# Patient Record
Sex: Female | Born: 1950 | Race: White | Hispanic: No | Marital: Married | Smoking: Never smoker
Health system: Southern US, Community
[De-identification: ages and names within clinical notes are randomized; demographics above are authoritative.]

## PROBLEM LIST (undated history)

## (undated) DIAGNOSIS — J302 Other seasonal allergic rhinitis: Secondary | ICD-10-CM

---

## 2012-11-11 ENCOUNTER — Encounter: Payer: Self-pay | Admitting: *Deleted

## 2012-11-11 ENCOUNTER — Emergency Department
Admission: EM | Admit: 2012-11-11 | Discharge: 2012-11-11 | Disposition: A | Discharge status: Home / Self Care | Payer: BC Managed Care – PPO | Source: Home / Self Care | Attending: Family Medicine | Admitting: Family Medicine

## 2012-11-11 ENCOUNTER — Emergency Department (INDEPENDENT_AMBULATORY_CARE_PROVIDER_SITE_OTHER): Payer: BC Managed Care – PPO

## 2012-11-11 DIAGNOSIS — S99929A Unspecified injury of unspecified foot, initial encounter: Secondary | ICD-10-CM

## 2012-11-11 DIAGNOSIS — M25569 Pain in unspecified knee: Secondary | ICD-10-CM

## 2012-11-11 DIAGNOSIS — IMO0002 Reserved for concepts with insufficient information to code with codable children: Secondary | ICD-10-CM

## 2012-11-11 DIAGNOSIS — S8391XA Sprain of unspecified site of right knee, initial encounter: Secondary | ICD-10-CM

## 2012-11-11 DIAGNOSIS — X500XXA Overexertion from strenuous movement or load, initial encounter: Secondary | ICD-10-CM

## 2012-11-11 HISTORY — DX: Other seasonal allergic rhinitis: J30.2

## 2012-11-11 MED ORDER — MELOXICAM 15 MG PO TABS: 15.0000 mg | ORAL_TABLET | Freq: Every day | ORAL | Status: AC

## 2012-11-11 NOTE — ED Notes (Signed)
Patient c/o right knee pain x 5 days. She twisted her knee when stepping off of a sidewalk then when sliding across a seat. No previous injury. Pain increases with movement.

## 2012-11-11 NOTE — ED Provider Notes (Signed)
History     CSN: [id number]  Arrival date & time 11/11/12  1620   First MD Initiated Contact with Patient 11/11/12 1650      Chief Complaint  Patient presents with  . Knee Pain      HPI Comments: Patient reports that she twisted her right knee about 5 days ago resulting in mild discomfort; later in the evening while sliding across a car seat she injured again resulting in increased right knee pain.  Her right knee pain has persisted, worse with weight bearing and walking.  It occasionally feels as if it may give way. She states that she embarked upon an organized exercise program about 3 months ago, and has had difficulty performing squats because of bilateral knee weakness (prior to her present injury).  Patient is a 62 y.o. female presenting with knee pain. The history is provided by the patient.  Knee Pain This is a new problem. Episode onset: 5 days ago. The problem occurs constantly. The problem has not changed since onset.Associated symptoms comments: none. The symptoms are aggravated by walking and standing (flexing right knee). The symptoms are relieved by NSAIDs. Treatments tried: Aleve. The treatment provided mild relief.    Past Medical History  Diagnosis Date  . Seasonal allergies     History reviewed. No pertinent past surgical history.  Family History  Problem Relation Age of Onset  . Aneurysm Father     History  Substance Use Topics  . Smoking status: Never Smoker   . Smokeless tobacco: Never Used  . Alcohol Use: Yes     Comment: 5/week    OB History    Grav Para Term Preterm Abortions TAB SAB Ect Mult Living                  Review of Systems  All other systems reviewed and are negative.    Allergies  Review of patient's allergies indicates no known allergies.  Home Medications   Current Outpatient Rx  Name  Route  Sig  Dispense  Refill  . MELOXICAM 15 MG PO TABS   Oral   Take 1 tablet (15 mg total) by mouth daily. Take with evening meal   15 tablet   0     BP 137/76  Pulse 67  Resp 14  Ht 5\' 6"  (1.676 m)  Wt 171 lb (77.565 kg)  BMI 27.60 kg/m2  SpO2 95%  Physical Exam  Constitutional: She is oriented to person, place, and time. She appears well-developed and well-nourished. No distress.  HENT:  Head: Atraumatic.  Eyes: Conjunctivae normal and EOM are normal. Pupils are equal, round, and reactive to light.  Musculoskeletal: She exhibits tenderness.       Right knee: She exhibits decreased range of motion. She exhibits no swelling, no effusion, no ecchymosis, no deformity, no erythema, normal alignment, no LCL laxity, normal patellar mobility, no bony tenderness, normal meniscus and no MCL laxity. tenderness found. No medial joint line, no lateral joint line, no MCL, no LCL and no patellar tendon tenderness noted.       Right knee reveals mild tenderness only in the popliteal fossa, but no swelling there.  She has discomfort with full extension.  McMurray test negative.  Knee stable; negative drawer test.  No erythema or warmth.  Neurological: She is alert and oriented to person, place, and time.  Skin: Skin is warm and dry. No rash noted.    ED Course  Procedures  none   Dg  Knee Complete 4 Views Right  11/11/2012  *RADIOLOGY REPORT*  Clinical Data: Right knee pain following a twisting injury 4 days ago.  RIGHT KNEE - COMPLETE 4+ VIEW  Comparison: None.  Findings: Mild medial and patellofemoral spur formation with mild medial joint space narrowing.  Minimal lateral spur formation.  No fracture, dislocation or effusion is seen.  Oval calcification in the popliteal space.  IMPRESSION:  1.  No fracture or effusion. 2.  Mild degenerative changes. 3.  Possible oval calcification in a popliteal cyst.  This is more posteriorly located than expected for a calcified popliteal artery aneurysm.   Original Report Authenticated By: Beckie Salts, M.D.      1. Right knee sprain       MDM  Hinged knee brace applied.  Rx for  Mobic. Apply ice pack two or three times daily.  Wear knee brace daytime.  Begin knee exercises in extension as per instruction sheet. Follow-up with Dr. Rodney Langton for management and coordinate knee (bilateral) knee strengthening program.         Lattie Haw, MD 11/11/12 1843

## 2012-11-12 ENCOUNTER — Ambulatory Visit (INDEPENDENT_AMBULATORY_CARE_PROVIDER_SITE_OTHER): Payer: BC Managed Care – PPO | Admitting: Sports Medicine

## 2012-11-12 ENCOUNTER — Telehealth: Payer: Self-pay | Admitting: *Deleted

## 2012-11-12 DIAGNOSIS — M2240 Chondromalacia patellae, unspecified knee: Secondary | ICD-10-CM | POA: Insufficient documentation

## 2012-11-12 NOTE — Progress Notes (Signed)
SPORTS MEDICINE CONSULTATION REPORT  Subjective:    I'm seeing this patient as a consultation for:  Dr. Cathren Harsh  CC: Knee pain  HPI: Teresa Hurley is a very pleasant 62 year old female who comes in with the unfortunate history of twisting her right knee with immediate pain. She went to the urgent care where x-rays were negative, she was placed in a knee brace, and sent here for further evaluation and definitive treatment. She is not yet take her Mobic. She localizes the pain over the anterior knee, and notes grinding of the knees taken to the range of motion. She denies any swelling. Pain is also worse when going up and down stairs, and she does note significant gelling through the day. Pain is moderate. She does do cross fit, and is wondering if she can continue this today.  She is also looking to switch primary care providers, and would like to switch to [place].  Past medical history, Surgical history, Family history not pertinant except as noted below, Social history, Allergies, and medications have been entered into the medical record, reviewed, and no changes needed.   Review of Systems: No headache, visual changes, nausea, vomiting, diarrhea, constipation, dizziness, abdominal pain, skin rash, fevers, chills, night sweats, weight loss, swollen lymph nodes, body aches, joint swelling, muscle aches, chest pain, shortness of breath, mood changes, visual or auditory hallucinations.   Objective:   General: Well Developed, well nourished, and in no acute distress.  Neuro/Psych: Alert and oriented x3, extra-ocular muscles intact, able to move all 4 extremities, sensation grossly intact. Skin: Warm and dry, no rashes noted.  Respiratory: Not using accessory muscles, speaking in full sentences, trachea midline.  Cardiovascular: Pulses palpable, no extremity edema. Abdomen: Does not appear distended. Right Knee: Normal to inspection with no erythema or effusion or obvious bony abnormalities. Palpation normal  with no warmth, joint line tenderness, patellar tenderness, or condyle tenderness. ROM full in flexion and extension and lower leg rotation. Ligaments with solid consistent endpoints including ACL, PCL, LCL, MCL. Negative Mcmurray's, Apley's, and Thessalonian tests. Non painful patellar compression. Patellar glide with significant crepitus. Patellar and quadriceps tendons unremarkable. Hamstring and quadriceps strength is normal.   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  Impression and Recommendations:   This case required medical decision making of moderate complexity.

## 2012-11-12 NOTE — Assessment & Plan Note (Addendum)
We had a long, long discussion about her options from here. She will fill her Mobic, and do home exercises. She does have a trip coming up to [place] in 2 weeks, if she has insufficient response to conservative therapy she will come in for an injection before this vacation. Otherwise I would like to set a tentative followup for 4 weeks. She did decline formal physical therapy, however if we do proceed to injection, I would like her to do this.

## 2012-11-16 ENCOUNTER — Ambulatory Visit: Payer: BC Managed Care – PPO | Admitting: Sports Medicine

## 2012-11-17 ENCOUNTER — Ambulatory Visit (INDEPENDENT_AMBULATORY_CARE_PROVIDER_SITE_OTHER): Payer: BC Managed Care – PPO | Admitting: Sports Medicine

## 2012-11-17 DIAGNOSIS — M2240 Chondromalacia patellae, unspecified knee: Secondary | ICD-10-CM

## 2012-11-17 NOTE — Progress Notes (Signed)
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 Overall her pain continues to be on the anterior aspect of both knees, worse with deep knee bends and going up stairs, radiation, mild swelling of the right knee, symptoms are moderate. No mechanical symptoms.  Past medical history, Surgical history, Family history not pertinant except as noted below, Social history, Allergies, and medications have been entered into the medical record, reviewed, and no changes needed.   Review of Systems: No headache, visual changes, nausea, vomiting, diarrhea, constipation, dizziness, abdominal pain, skin rash, fevers, chills, night sweats, weight loss, swollen lymph nodes, body aches, joint swelling, muscle aches, chest pain, shortness of breath, mood changes, visual or auditory hallucinations.   Objective:   General: Well Developed, well nourished, and in no acute distress.  Neuro/Psych: Alert and oriented x3, extra-ocular muscles intact, able to move all 4 extremities, sensation grossly intact. Skin: Warm and dry, no rashes noted.  Respiratory: Not using accessory muscles, speaking in full sentences, trachea midline.  Cardiovascular: Pulses palpable, no extremity edema. Abdomen: Does not appear distended. Bilateral Knee: Normal to inspection with no erythema or effusion or obvious bony abnormalities. Palpation normal with no warmth, joint line tenderness, patellar tenderness, or condyle tenderness. ROM full in flexion and extension and lower leg rotation. Ligaments with solid consistent endpoints including ACL, PCL, LCL, MCL. Negative Mcmurray's, Apley's, and Thessalonian tests. Non painful patellar compression. Patellar glide with crepitus. Patellar and quadriceps tendons  unremarkable. Hamstring and quadriceps strength is normal.   Procedure: Real-time Ultrasound Guided Injection of left suprapatellar recess. Device: GE Logiq E  Ultrasound guided injection is preferred based studies that show increased duration, increased effect, greater accuracy, decreased procedural pain, increased response rate, and decreased cost with ultrasound guided versus blind injection.  Verbal informed consent obtained.  Time-out conducted.  Noted no overlying erythema, induration, or other signs of local infection.  Skin prepped in a sterile fashion.  Local anesthesia: Topical Ethyl chloride.  With sterile technique and under real time ultrasound guidance:  2 cc Kenalog 40, 4 cc lidocaine injected into the suprapatellar recess. Completed without difficulty  Pain immediately resolved suggesting accurate placement of the medication.  Advised to call if fevers/chills, erythema, induration, drainage, or persistent bleeding.  Images permanently stored and available for review in the ultrasound unit.  Impression: Technically successful ultrasound guided injection.  Procedure: Real-time Ultrasound Guided Injection of right suprapatellar recess. Device: GE Logiq E  Ultrasound guided injection is preferred based studies that show increased duration, increased effect, greater accuracy, decreased procedural pain, increased response rate, and decreased cost with ultrasound guided versus blind injection.  Verbal informed consent obtained.  Time-out conducted.  Noted no overlying erythema, induration, or other signs of local infection.  Skin prepped in a sterile fashion.  Local anesthesia: Topical Ethyl chloride.  With sterile technique and under real time ultrasound guidance:  2 cc Kenalog 40, 4 cc lidocaine injected into the suprapatellar recess. Completed without difficulty  Pain immediately resolved suggesting accurate placement of the medication.  Advised to call if fevers/chills,  erythema, induration, drainage, or persistent bleeding.  Images permanently stored and available for review in the ultrasound unit.  Impression: Technically successful ultrasound guided injection.  Impression and Recommendations:   This case required medical decision making of moderate  complexity.

## 2012-11-17 NOTE — Assessment & Plan Note (Signed)
Ultrasound guided intra-articular injection bilaterally as above. Continue patellofemoral rehabilitation. Avoid deep knee bends past 90. Return to see me in 4 weeks, we can consider repeat injection versus MRI if no better.

## 2012-11-19 ENCOUNTER — Telehealth: Payer: Self-pay | Admitting: *Deleted

## 2012-11-19 ENCOUNTER — Other Ambulatory Visit: Payer: Self-pay | Admitting: Sports Medicine

## 2012-11-19 NOTE — Telephone Encounter (Signed)
I could give her some medicine to hold her over during the vacation, this would be Vicodin. We could certainly try a single additional injection but I would not do any more to the right knee in terms of steroid injections. When she gets back, we should certainly consider Visco supplementation. Have her let me know what she wants to do. I am happy to work her in if she desires a single additional injection to the right knee before vacation.

## 2012-11-19 NOTE — Telephone Encounter (Signed)
Pt called stating that the injection in her RT knee didn't work. States she is going out of the country on Monday and would like to know when she could get another injection or what is her next option. Please advise.

## 2012-11-22 NOTE — Telephone Encounter (Signed)
LMOM

## 2012-12-07 ENCOUNTER — Ambulatory Visit (INDEPENDENT_AMBULATORY_CARE_PROVIDER_SITE_OTHER): Payer: BC Managed Care – PPO

## 2012-12-07 ENCOUNTER — Ambulatory Visit (INDEPENDENT_AMBULATORY_CARE_PROVIDER_SITE_OTHER): Payer: BC Managed Care – PPO | Admitting: Sports Medicine

## 2012-12-07 DIAGNOSIS — M2240 Chondromalacia patellae, unspecified knee: Secondary | ICD-10-CM

## 2012-12-07 DIAGNOSIS — M722 Plantar fascial fibromatosis: Secondary | ICD-10-CM

## 2012-12-07 NOTE — Progress Notes (Signed)
  Subjective:    CC: Followup  HPI: Knees: Patellofemoral chondromalacia, improved status post injection.  Right foot pain: Was very painful on her vacation, particularly at the calcaneal insertion of the plantar fascia. She reported some swelling and redness over the toes. She went to the staff physician who thought it was gout, and treated her with dexamethasone. Overall her symptoms have improved with the exception of pain at the calcaneal insertion of the plantar fascia, worse in the morning, worse with weightbearing. Pain is localized, does not radiate.  Past medical history, Surgical history, Family history not pertinant except as noted below, Social history, Allergies, and medications have been entered into the medical record, reviewed, and no changes needed.   Review of Systems: No headache, visual changes, nausea, vomiting, diarrhea, constipation, dizziness, abdominal pain, skin rash, fevers, chills, night sweats, weight loss, swollen lymph nodes, body aches, joint swelling, muscle aches, chest pain, shortness of breath, mood changes, visual or auditory hallucinations.   Objective:   General: Well Developed, well nourished, and in no acute distress.  Neuro/Psych: Alert and oriented x3, extra-ocular muscles intact, able to move all 4 extremities, sensation grossly intact. Skin: Warm and dry, no rashes noted.  Respiratory: Not using accessory muscles, speaking in full sentences, trachea midline.  Cardiovascular: Pulses palpable, no extremity edema. Abdomen: Does not appear distended. Right Ankle: No visible erythema or swelling. Range of motion is full in all directions. Strength is 5/5 in all directions. Stable lateral and medial ligaments; squeeze test and kleiger test unremarkable; Talar dome nontender; Tender to palpation in the calcaneal portion of the plantar fascia from the plantar aspect. No pain at base of 5th MT; No tenderness over cuboid; No tenderness over N spot or  navicular prominence No tenderness on posterior aspects of lateral and medial malleolus No sign of peroneal tendon subluxations or tenderness to palpation Negative tarsal tunnel tinel's Able to walk 4 steps.  X-ray showing small plantar calcaneal spur. Impression and Recommendations:   This case required medical decision making of moderate complexity.

## 2012-12-07 NOTE — Assessment & Plan Note (Addendum)
Home rehabilitation. She will come back for custom orthotics. X-rays. Some of her symptoms were reminiscent of gout, I am going to check a uric acid level. If insufficient benefit we are going to perform a guided injection.

## 2012-12-07 NOTE — Assessment & Plan Note (Signed)
Symptoms completely resolved at the bilateral injection.

## 2012-12-08 LAB — URIC ACID: Uric Acid, Serum: 5.3 mg/dL (ref 2.4–7.0)

## 2012-12-14 ENCOUNTER — Ambulatory Visit (INDEPENDENT_AMBULATORY_CARE_PROVIDER_SITE_OTHER): Payer: BC Managed Care – PPO | Admitting: Sports Medicine

## 2012-12-14 DIAGNOSIS — M722 Plantar fascial fibromatosis: Secondary | ICD-10-CM

## 2012-12-14 NOTE — Progress Notes (Signed)
Patient was fitted for a : standard, cushioned, semi-rigid orthotic. The orthotic was heated and afterward the patient stood on the orthotic blank positioned on the orthotic stand. The patient was positioned in subtalar neutral position and 10 degrees of ankle dorsiflexion in a weight bearing stance. After completion of molding, a stable base was applied to the orthotic blank. The blank was ground to a stable position for weight bearing. Size: 9 Base: Blue EVA Additional Posting and Padding: None The patient ambulated these, and they were very comfortable.  I spent 40 minutes with this patient, greater than 50% was face to face time regarding counseling for plantar fasciitis and patellofemoral chondromalacia.

## 2012-12-14 NOTE — Assessment & Plan Note (Signed)
Orthotics as above. Continue rehabilitation exercises. No better in 4 weeks consider plantar fascia injection.

## 2012-12-15 ENCOUNTER — Ambulatory Visit: Payer: BC Managed Care – PPO | Admitting: Sports Medicine

## 2013-01-31 IMAGING — CR DG KNEE COMPLETE 4+V*R*
4 series · 4 of 4 positions shown · non-contrast
Comparison: None.

CLINICAL DATA: Right knee pain following a twisting injury 4 days
ago.

RIGHT KNEE - COMPLETE 4+ VIEW

[view not recorded (1 of 4)]
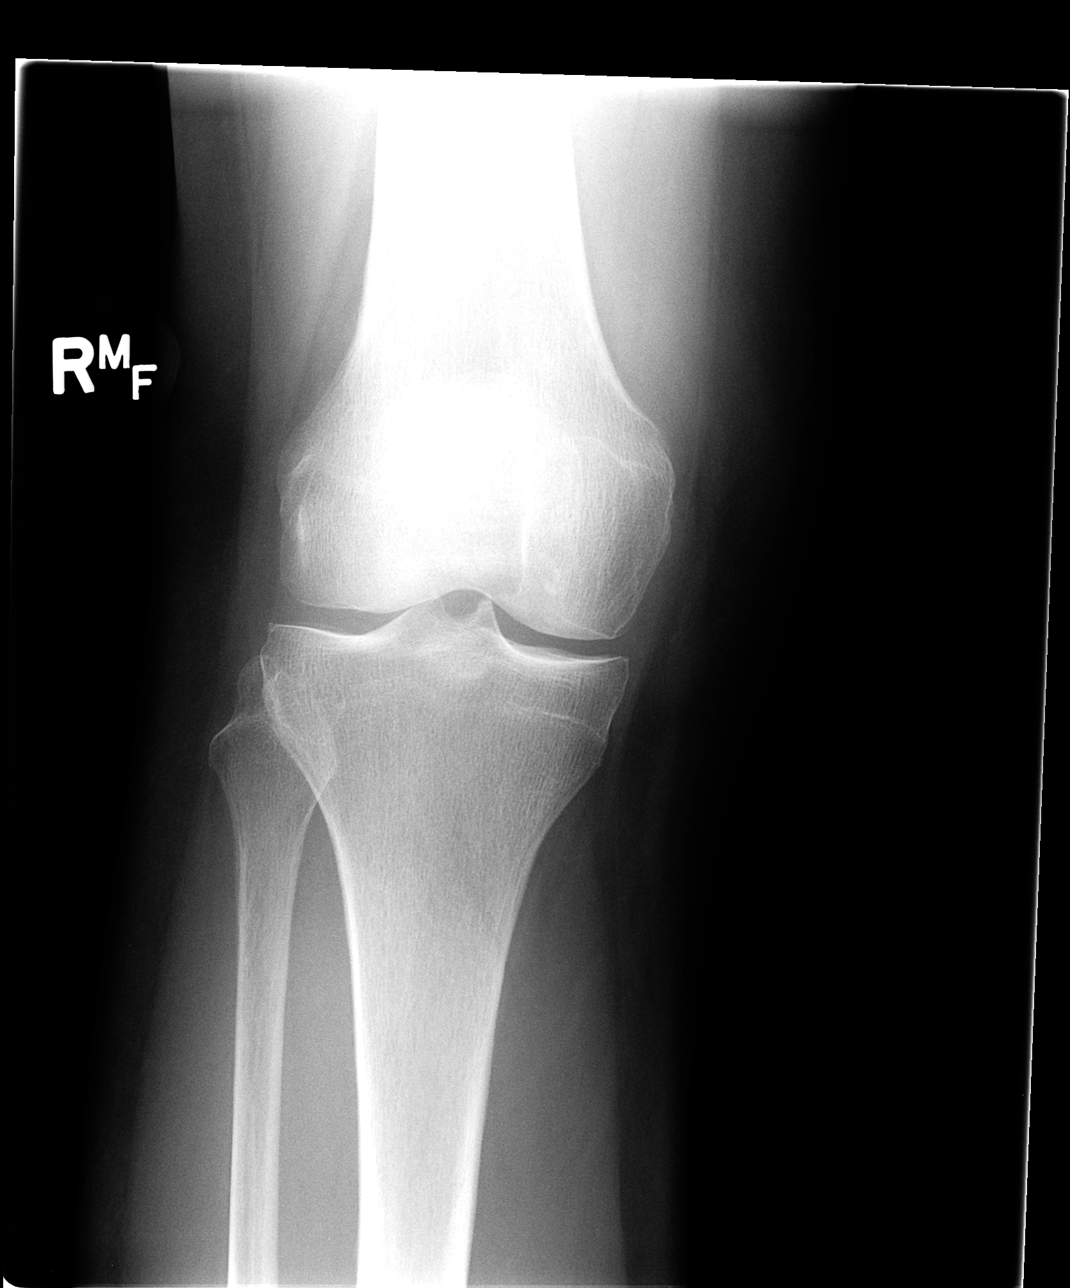

[view not recorded (2 of 4)]
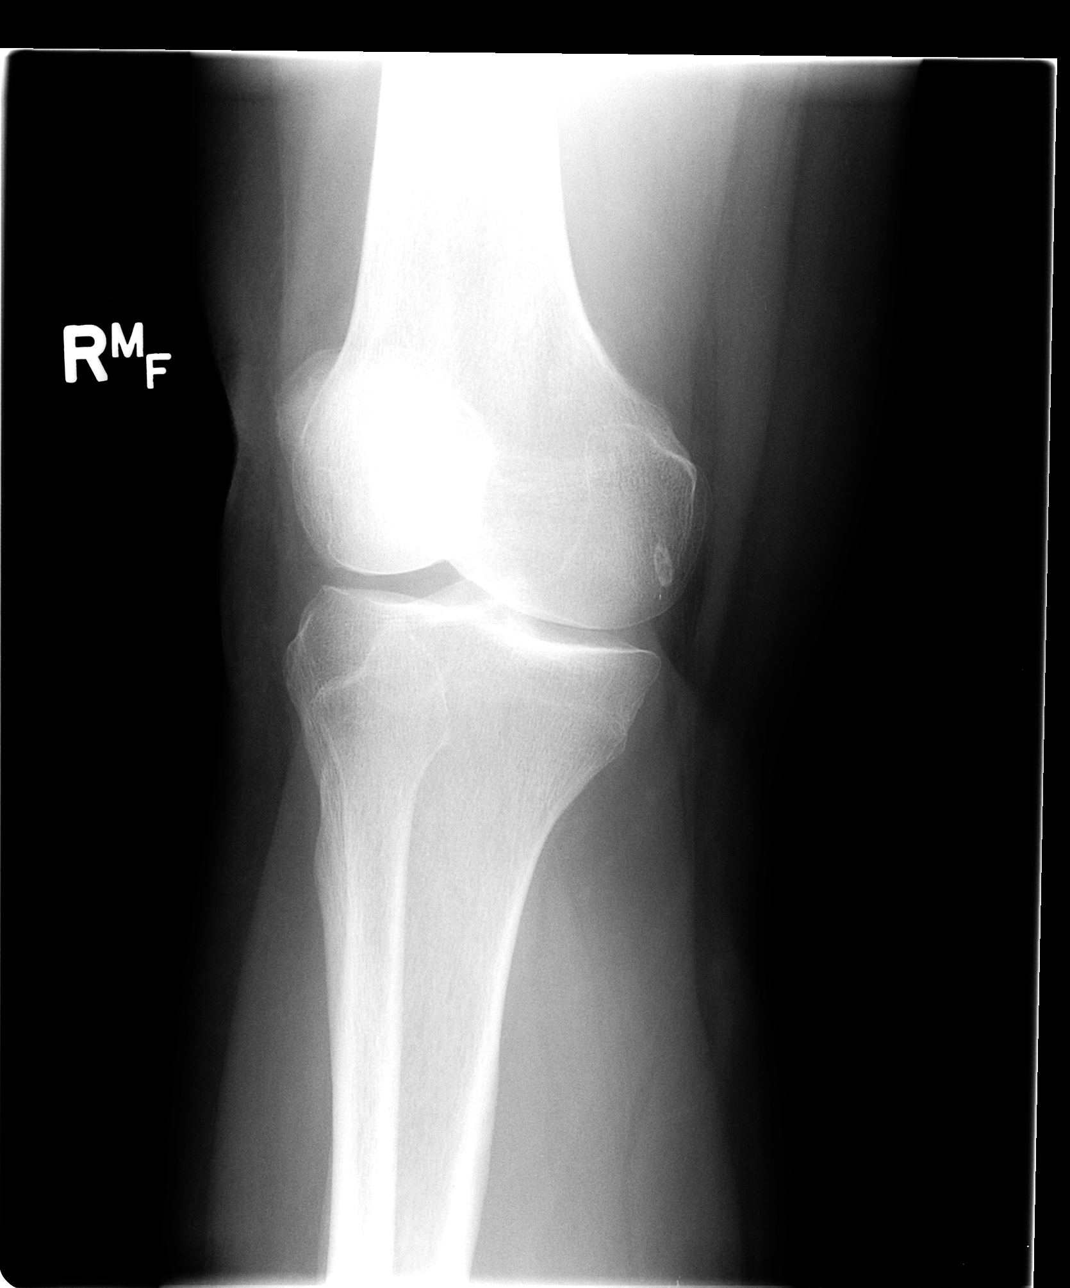

[view not recorded (3 of 4)]
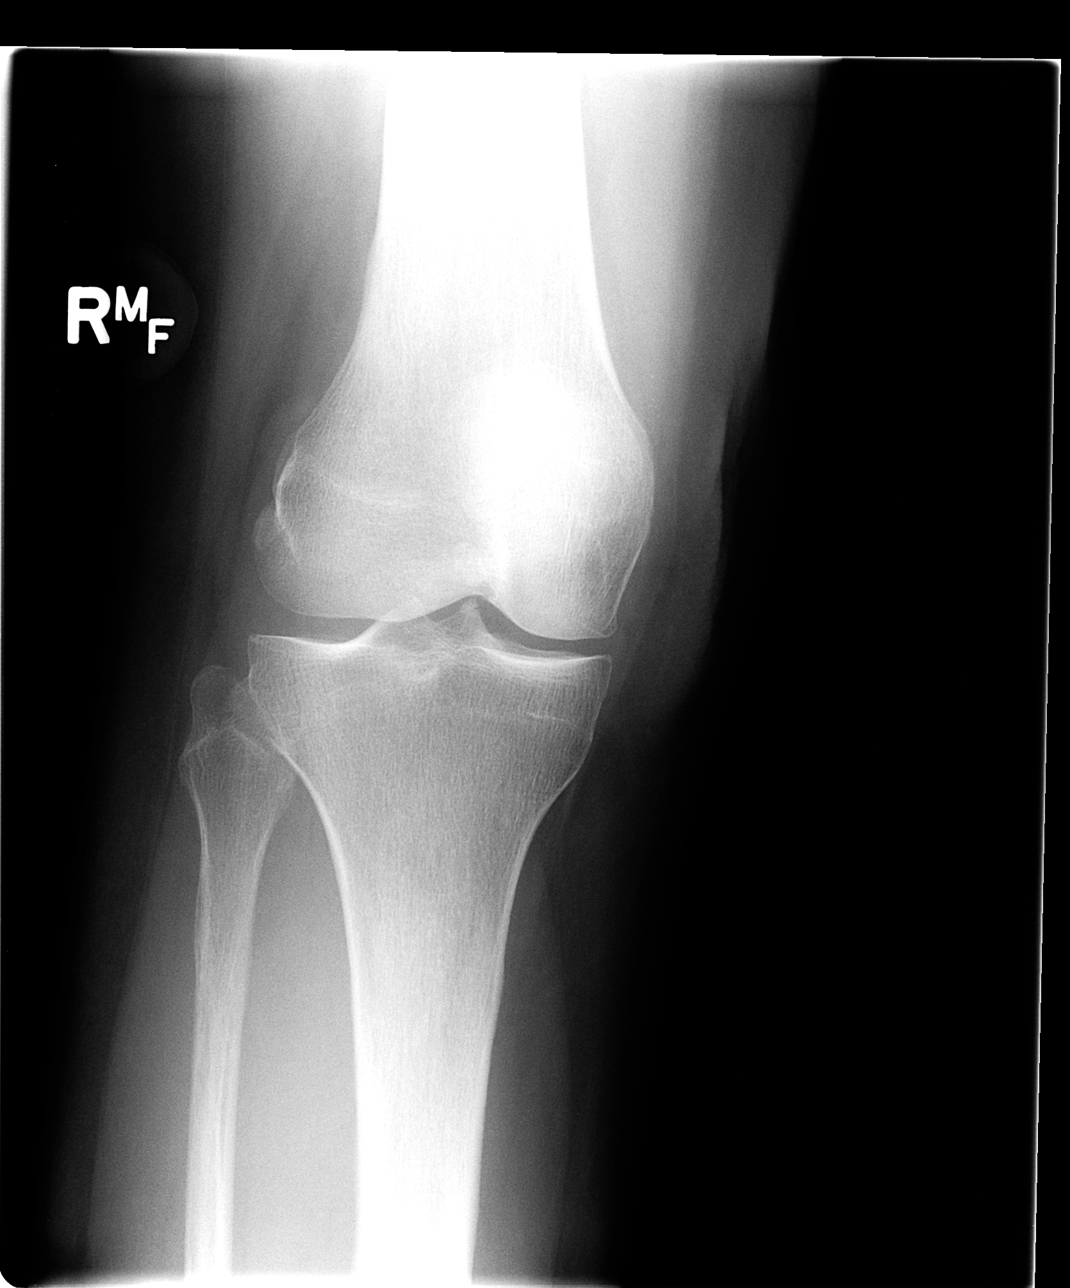

[view not recorded (4 of 4)]
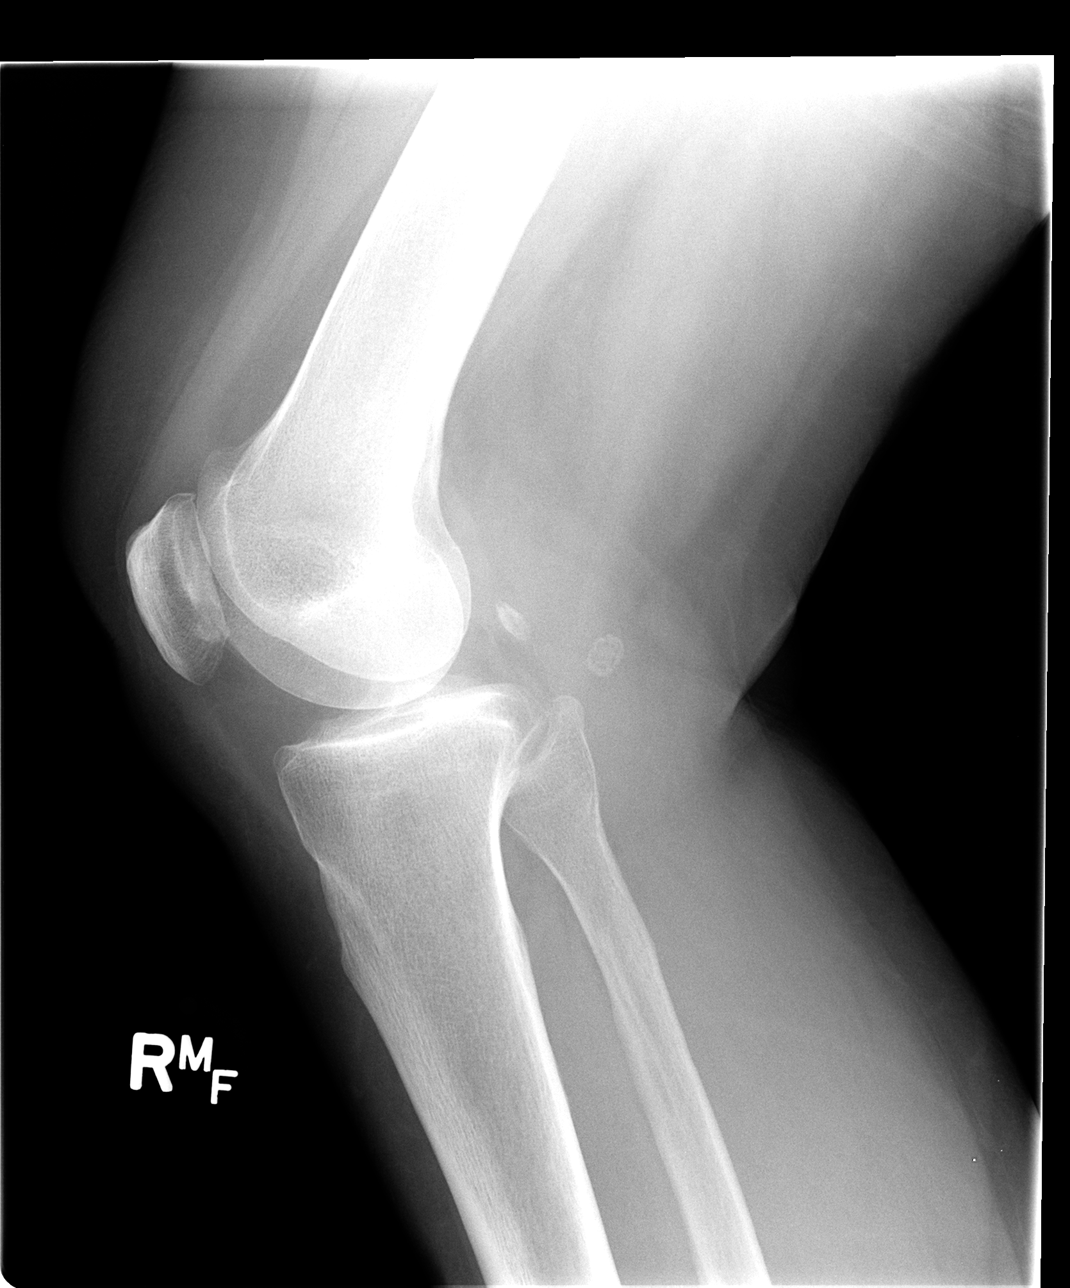

[4 of 4 positions shown; findings below may reference images not displayed]

FINDINGS: Mild medial and patellofemoral spur formation with mild
medial joint space narrowing.  Minimal lateral spur formation.  No
fracture, dislocation or effusion is seen.  Oval calcification in
the popliteal space.
IMPRESSION: 1.  No fracture or effusion.
2.  Mild degenerative changes.
3.  Possible oval calcification in a popliteal cyst.  This is more
posteriorly located than expected for a calcified popliteal artery
aneurysm.

## 2013-02-26 IMAGING — CR DG FOOT COMPLETE 3+V*R*
3 series · 3 of 3 positions shown · non-contrast
Comparison: None.

CLINICAL DATA: Right lateral foot pain, no injury

RIGHT FOOT COMPLETE - 3+ VIEW

[view not recorded (1 of 3)]
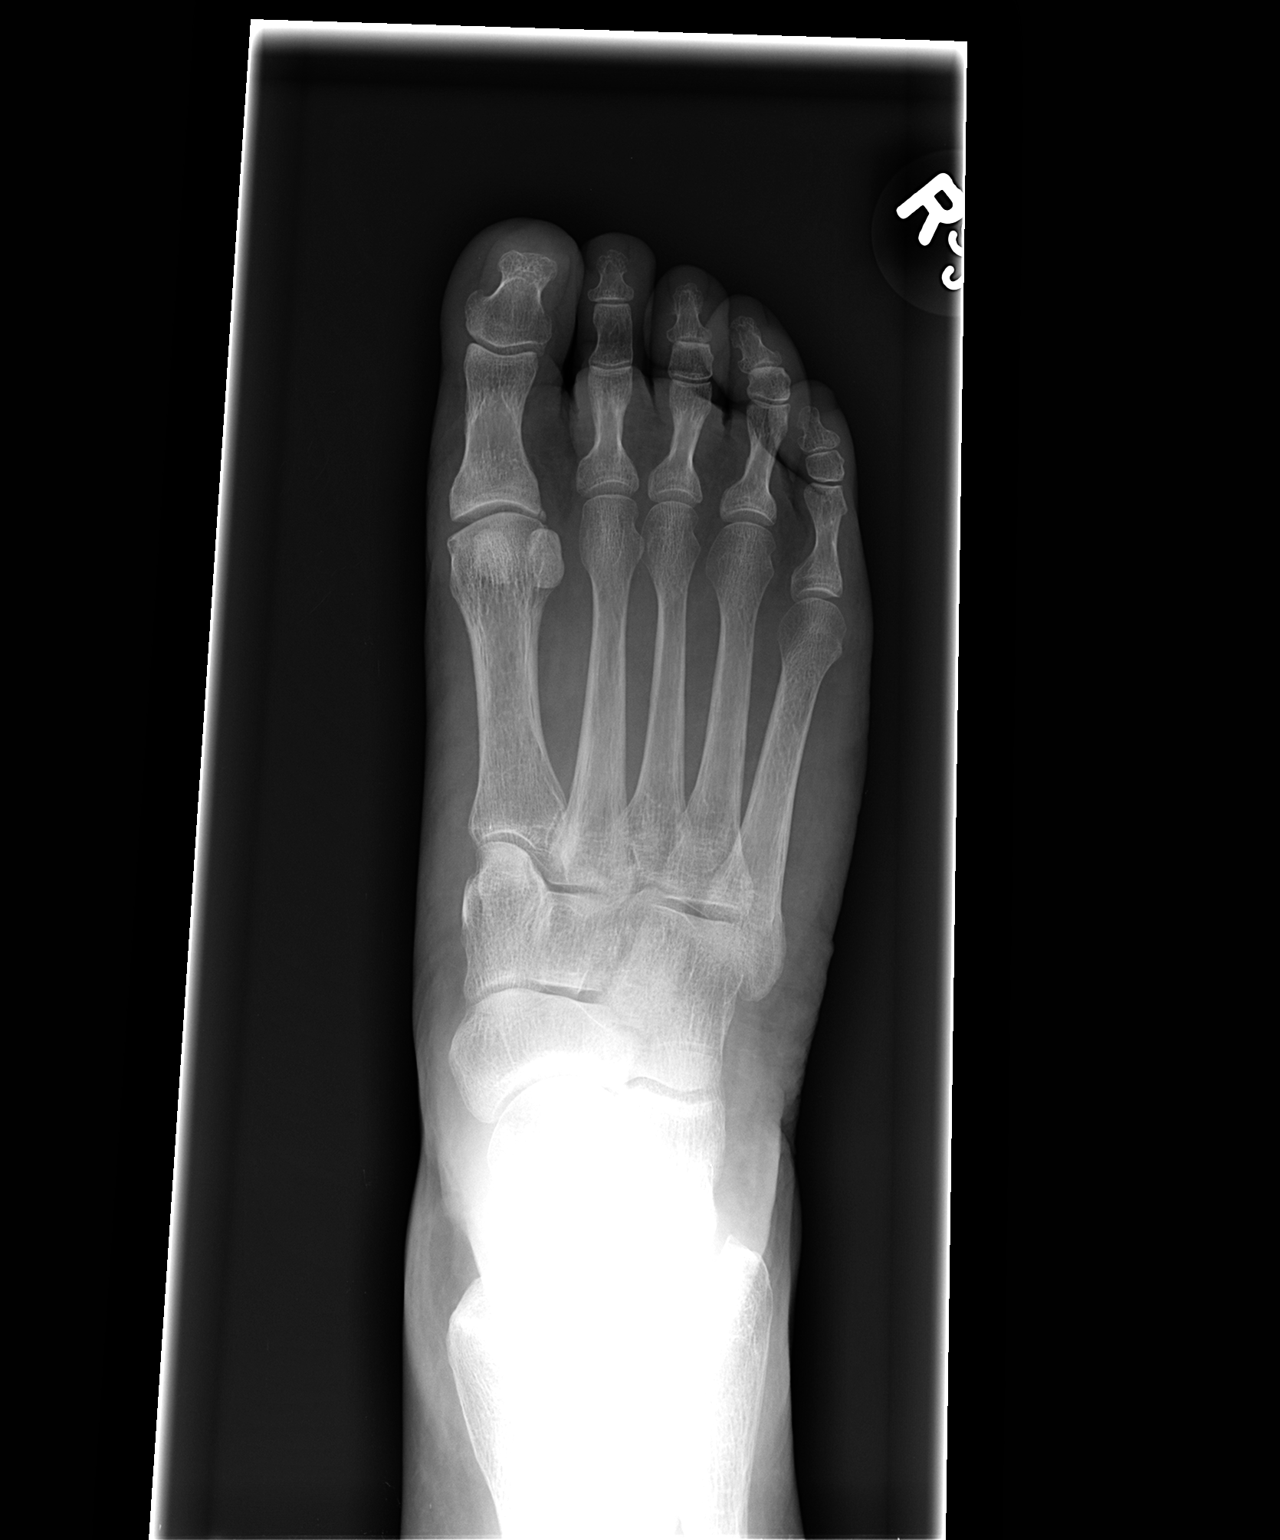

[view not recorded (2 of 3)]
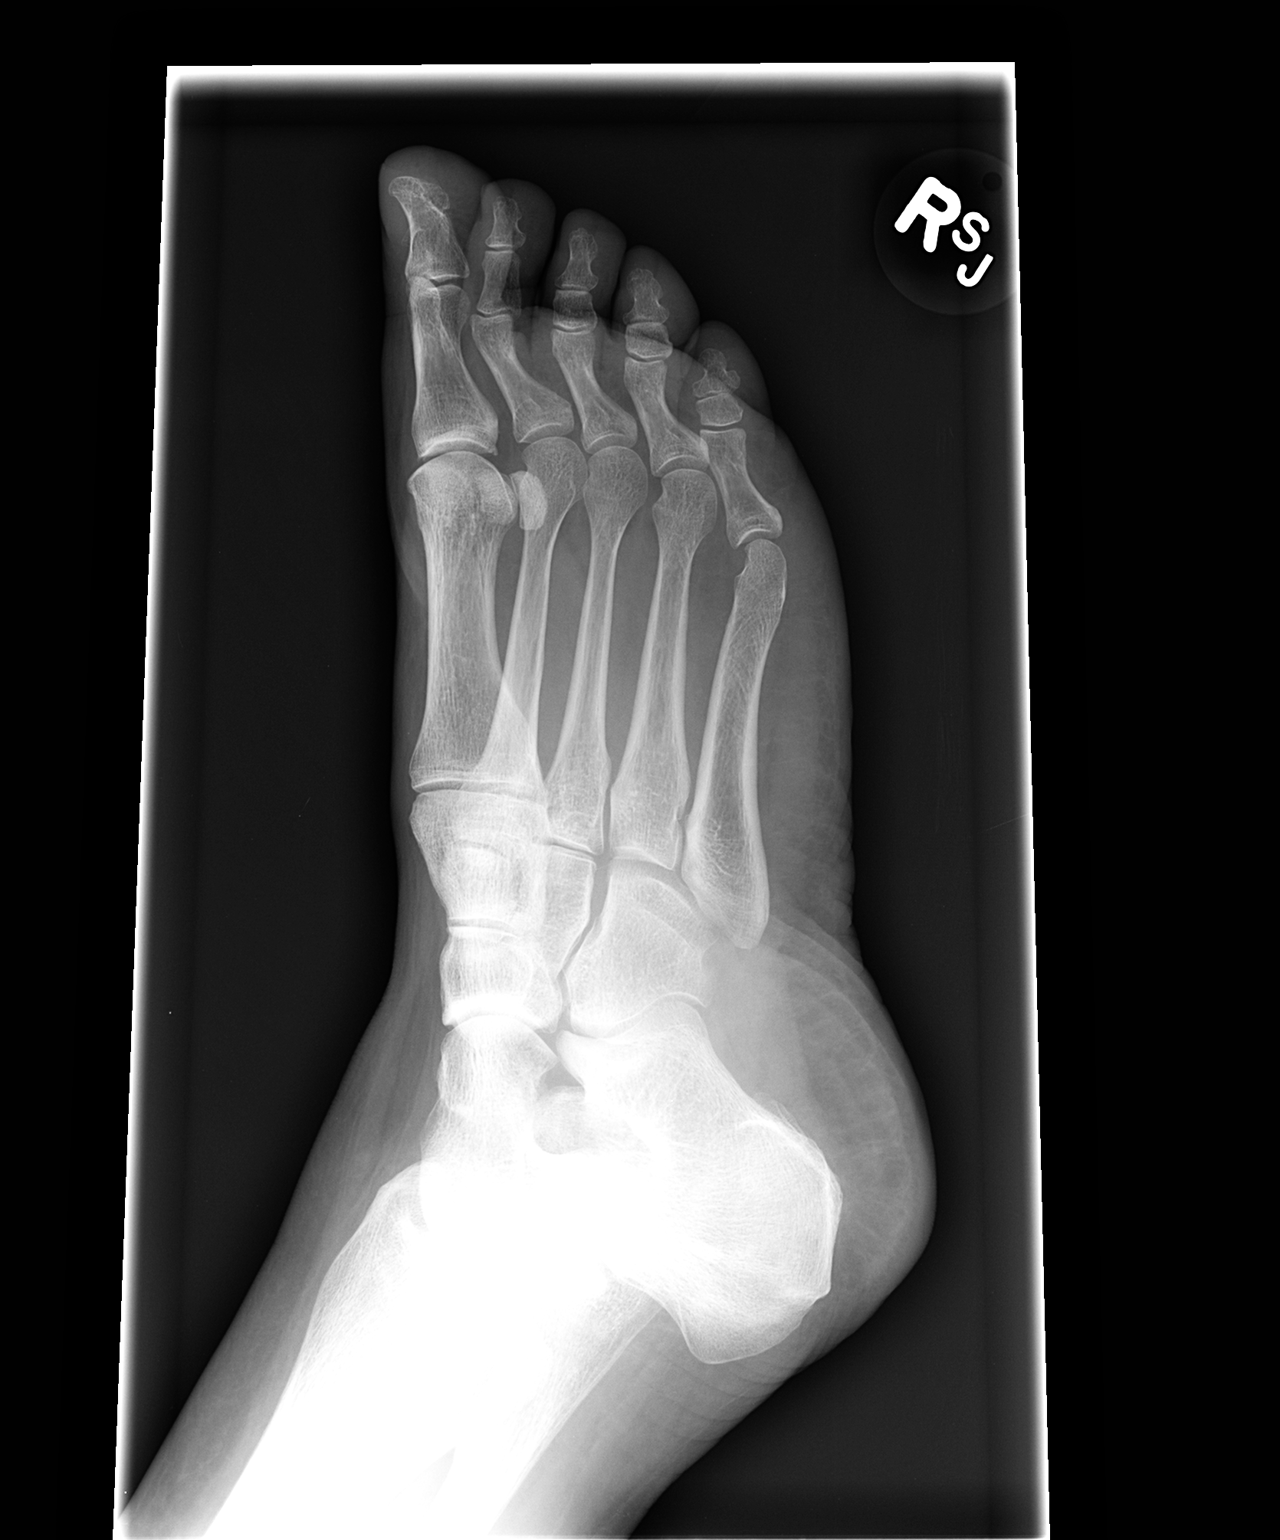

[view not recorded (3 of 3)]
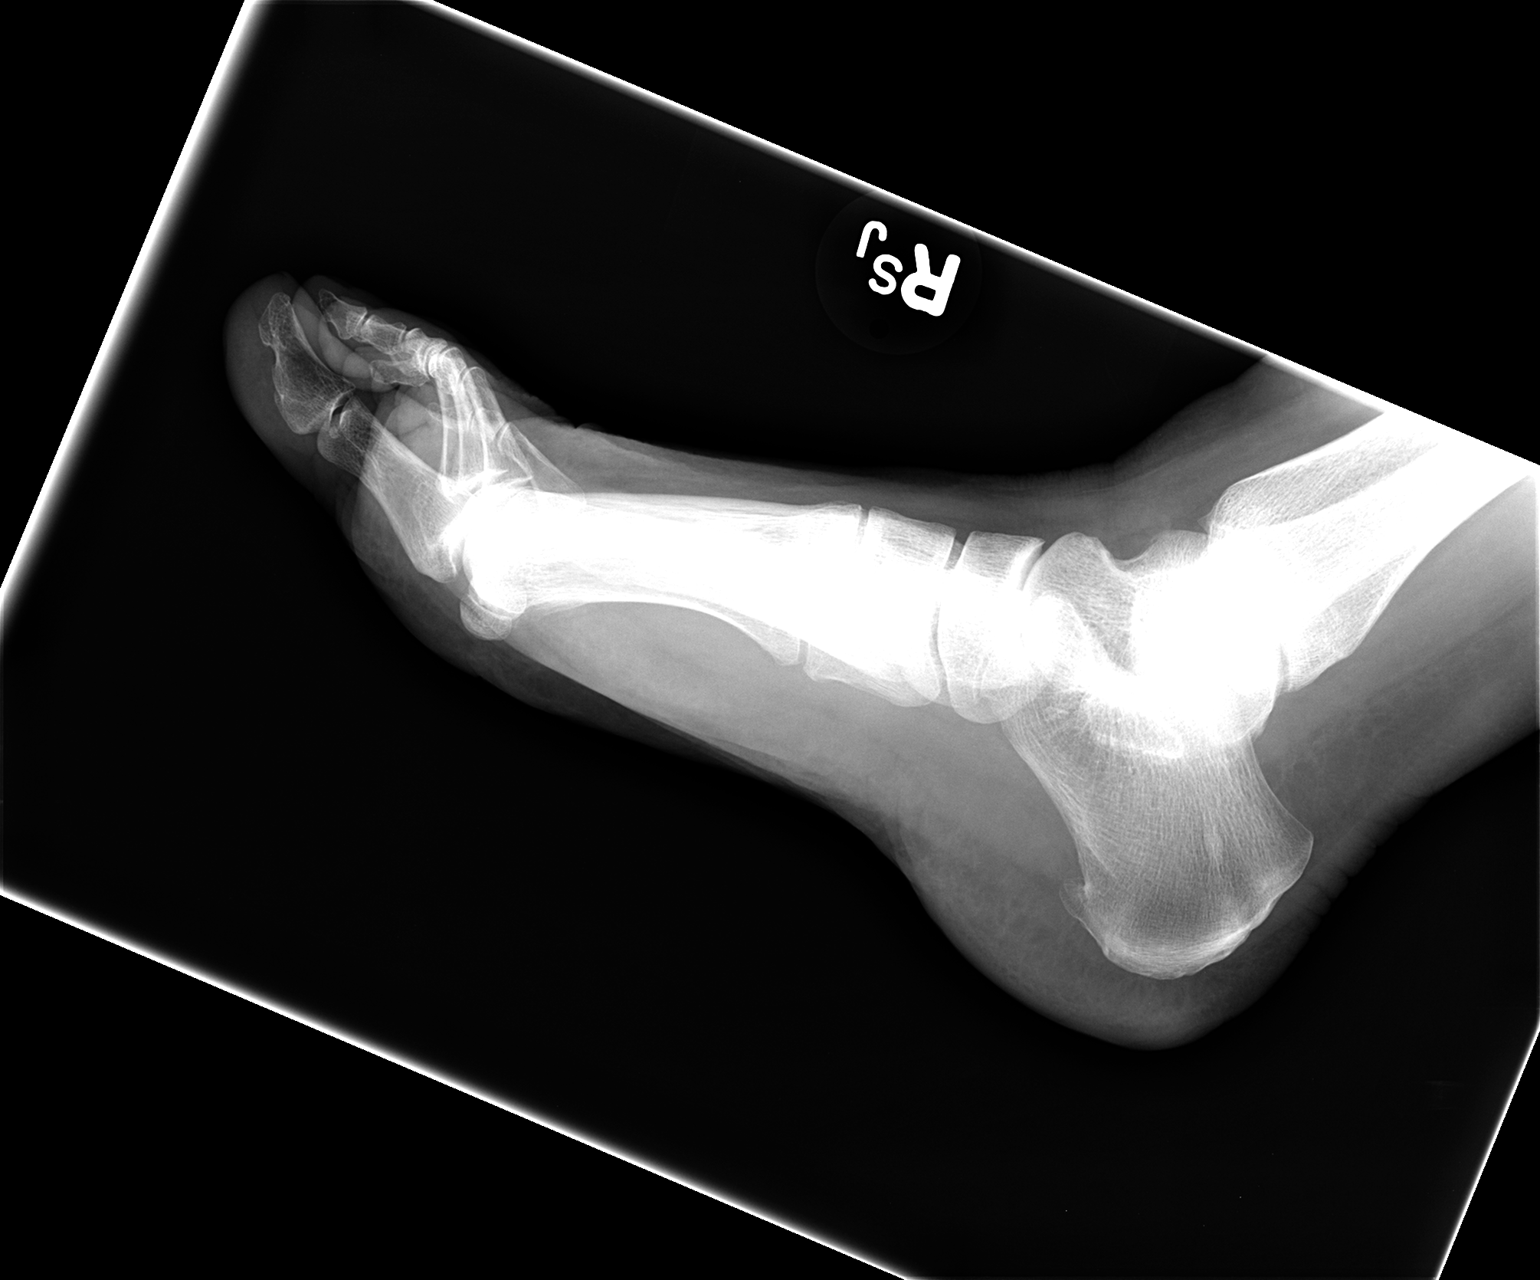

[3 of 3 positions shown; findings below may reference images not displayed]

FINDINGS: Tarsal - metatarsal alignment is normal.  No fracture is
seen.  Joint spaces appear normal.  A small plantar calcaneal
degenerative spur is noted.
IMPRESSION: No acute abnormality.  Small plantar calcaneal degenerative spur.

## 2018-02-15 ENCOUNTER — Emergency Department
Admission: EM | Admit: 2018-02-15 | Discharge: 2018-02-15 | Disposition: A | Discharge status: Home / Self Care | Payer: Medicare Other | Source: Home / Self Care

## 2018-02-15 ENCOUNTER — Other Ambulatory Visit: Payer: Self-pay

## 2018-02-15 ENCOUNTER — Encounter: Payer: Self-pay | Admitting: Emergency Medicine

## 2018-02-15 DIAGNOSIS — R1032 Left lower quadrant pain: Secondary | ICD-10-CM

## 2018-02-15 DIAGNOSIS — M545 Low back pain, unspecified: Secondary | ICD-10-CM

## 2018-02-15 LAB — POCT URINALYSIS DIP (MANUAL ENTRY)
Bilirubin, UA: NEGATIVE
GLUCOSE UA: NEGATIVE mg/dL
Leukocytes, UA: NEGATIVE
NITRITE UA: NEGATIVE
PROTEIN UA: NEGATIVE mg/dL
SPEC GRAV UA: 1.02 (ref 1.010–1.025)
UROBILINOGEN UA: 0.2 U/dL
pH, UA: 5.5 (ref 5.0–8.0)

## 2018-02-15 NOTE — Discharge Instructions (Addendum)
Go to the Emergency department where you can have a ct scan done for evaluation of left lower abdominal pain

## 2018-02-15 NOTE — ED Triage Notes (Signed)
Pt c/o of LLQ that began last night. .She states the pain is intermittent. She denies n/v/d.

## 2018-02-16 ENCOUNTER — Telehealth: Payer: Self-pay

## 2018-02-16 NOTE — Telephone Encounter (Signed)
Mailbox is full and cannot accept any msgs

## 2018-02-16 NOTE — ED Provider Notes (Signed)
[facility]    CSN: [id number] Arrival date & time: 02/15/18  1356     History   Chief Complaint Chief Complaint  Patient presents with  . Abdominal Pain    HPI Teresa Hurley is a 67 y.o. female.   The history is provided by the patient. No language interpreter was used.  Abdominal Pain  Pain location:  LLQ Pain radiates to:  LLQ Onset quality:  Gradual Duration:  1 day Timing:  Constant Progression:  Worsening Chronicity:  New Relieved by:  Nothing Worsened by:  Nothing Ineffective treatments:  None tried Associated symptoms: nausea   Risk factors: not pregnant     Past Medical History:  Diagnosis Date  . Seasonal allergies     Patient Active Problem List   Diagnosis Date Noted  . Plantar fasciitis of right foot 12/07/2012  . Bilateral patellofemoral chondromalacia. 11/12/2012    History reviewed. No pertinent surgical history.  OB History   None      Home Medications    Prior to Admission medications   Medication Sig Start Date End Date Taking? Authorizing Provider  meloxicam (MOBIC) 15 MG tablet Take 1 tablet (15 mg total) by mouth daily. Take with evening meal 11/11/12   Lattie Haw, MD    Family History Family History  Problem Relation Age of Onset  . Aneurysm Father     Social History Social History   Tobacco Use  . Smoking status: Never Smoker  . Smokeless tobacco: Never Used  Substance Use Topics  . Alcohol use: Yes    Comment: 5/week  . Drug use: No     Allergies   Patient has no known allergies.   Review of Systems Review of Systems  Gastrointestinal: Positive for abdominal pain and nausea.  All other systems reviewed and are negative.    Physical Exam Triage Vital Signs ED Triage Vitals  Enc Vitals Group     BP 02/15/18 1424 139/82     Pulse Rate 02/15/18 1424 83     Resp --      Temp 02/15/18 1424 97.6 F (36.4 C)     Temp Source 02/15/18 1424 Oral     SpO2 02/15/18 1424 99 %     Weight  02/15/18 1425 167 lb (75.8 kg)     Height 02/15/18 1425  (1.702 m)     Head Circumference --      Peak Flow --      Pain Score 02/15/18 1424 0     Pain Loc --      Pain Edu? --      Excl. in GC? --    No data found.  Updated Vital Signs BP 139/82 (BP Location: Left Arm)   Pulse 83   Temp 97.6 F (36.4 C) (Oral)   Ht  (1.702 m)   Wt 167 lb (75.8 kg)   SpO2 99%   BMI 26.16 kg/m   Visual Acuity Right Eye Distance:   Left Eye Distance:   Bilateral Distance:    Right Eye Near:   Left Eye Near:    Bilateral Near:     Physical Exam  Constitutional: She is oriented to person, place, and time. She appears well-developed and well-nourished.  HENT:  Head: Normocephalic.  Mouth/Throat: Oropharynx is clear and moist.  Eyes: EOM are normal.  Neck: Normal range of motion.  Pulmonary/Chest: Effort normal.  Abdominal: Normal appearance and bowel sounds are normal. She exhibits no distension. There is  tenderness in the left lower quadrant.  Musculoskeletal: Normal range of motion.  Neurological: She is alert and oriented to person, place, and time.  Skin: Skin is warm.  Psychiatric: She has a normal mood and affect.  Nursing note and vitals reviewed.    UC Treatments / Results  Labs (all labs ordered are listed, but only abnormal results are displayed) Labs Reviewed  POCT URINALYSIS DIP (MANUAL ENTRY) - Abnormal; Notable for the following components:      Result Value   Blood, UA small (*)    All other components within normal limits    EKG None  Radiology No results found.  Procedures Procedures (including critical care time)  Medications Ordered in UC Medications - No data to display  Initial Impression / Assessment and Plan / UC Course  I have reviewed the triage vital signs and the nursing notes.  Pertinent labs & imaging results that were available during my care of the patient were reviewed by me and considered in my medical decision making (see  chart for details).      MDm   Pt needs evaluation that is not available here.   Pt advised to go to ED to be seen   Final Clinical Impressions(s) / UC Diagnoses   Final diagnoses:  Acute left-sided low back pain without sciatica  Left lower quadrant pain     Discharge Instructions     Go to the Emergency department where you can have a ct scan done for evaluation of left lower abdominal pain   ED Prescriptions    None     Controlled Substance Prescriptions [place] Controlled Substance Registry consulted? Not Applicable   [place], [place] 02/16/18 1005
